# Patient Record
Sex: Male | Born: 1990 | Race: White | Hispanic: No | Marital: Single | Smoking: Current every day smoker
Health system: Southern US, Community
[De-identification: ages and names within clinical notes are randomized; demographics above are authoritative.]

## PROBLEM LIST (undated history)

## (undated) DIAGNOSIS — G43909 Migraine, unspecified, not intractable, without status migrainosus: Secondary | ICD-10-CM

## (undated) DIAGNOSIS — I10 Essential (primary) hypertension: Secondary | ICD-10-CM

## (undated) HISTORY — PX: HERNIA REPAIR: SHX51

## (undated) HISTORY — PX: ELBOW SURGERY: SHX618

## (undated) HISTORY — PX: TONSILLECTOMY: SUR1361

---
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
## 2009-06-15 ENCOUNTER — Ambulatory Visit: Payer: Self-pay | Admitting: Internal Medicine

## 2009-06-15 DIAGNOSIS — R51 Headache: Secondary | ICD-10-CM

## 2009-06-15 DIAGNOSIS — F17200 Nicotine dependence, unspecified, uncomplicated: Secondary | ICD-10-CM | POA: Insufficient documentation

## 2009-06-15 DIAGNOSIS — Z9189 Other specified personal risk factors, not elsewhere classified: Secondary | ICD-10-CM

## 2009-06-15 DIAGNOSIS — R519 Headache, unspecified: Secondary | ICD-10-CM | POA: Insufficient documentation

## 2009-06-15 DIAGNOSIS — R05 Cough: Secondary | ICD-10-CM

## 2009-06-15 DIAGNOSIS — R059 Cough, unspecified: Secondary | ICD-10-CM | POA: Insufficient documentation

## 2009-06-15 LAB — CONVERTED CEMR LAB: Chlamydia, Swab/Urine, PCR: NEGATIVE

## 2009-06-16 ENCOUNTER — Telehealth: Payer: Self-pay | Admitting: *Deleted

## 2009-06-16 LAB — CONVERTED CEMR LAB
Albumin: 4.3 g/dL (ref 3.5–5.2)
Alkaline Phosphatase: 67 units/L (ref 39–117)
Basophils Absolute: 0 10*3/uL (ref 0.0–0.1)
CO2: 30 meq/L (ref 19–32)
Chloride: 107 meq/L (ref 96–112)
Eosinophils Absolute: 0.1 10*3/uL (ref 0.0–0.7)
Glucose, Bld: 96 mg/dL (ref 70–99)
HCT: 44.5 % (ref 39.0–52.0)
HDL: 29.5 mg/dL — ABNORMAL LOW (ref >39.00)
Hemoglobin: 15.1 g/dL (ref 13.0–17.0)
LDL Cholesterol: 63 mg/dL (ref 0–99)
Lymphocytes Relative: 29.5 % (ref 12.0–46.0)
Lymphs Abs: 2.1 10*3/uL (ref 0.7–4.0)
Monocytes Absolute: 0.5 10*3/uL (ref 0.1–1.0)
Monocytes Relative: 7.2 % (ref 3.0–12.0)
Neutro Abs: 4.4 10*3/uL (ref 1.4–7.7)
Neutrophils Relative %: 61.9 % (ref 43.0–77.0)
Platelets: 224 10*3/uL (ref 150.0–400.0)
Potassium: 4.2 meq/L (ref 3.5–5.1)
Sodium: 143 meq/L (ref 135–145)
Total CHOL/HDL Ratio: 4
Triglycerides: 148 mg/dL (ref 0.0–149.0)

## 2009-06-29 ENCOUNTER — Encounter: Admission: RE | Admit: 2009-06-29 | Discharge: 2009-06-29 | Payer: Self-pay | Admitting: Psychiatry

## 2009-09-09 ENCOUNTER — Ambulatory Visit: Payer: Self-pay | Admitting: Internal Medicine

## 2009-09-09 DIAGNOSIS — G479 Sleep disorder, unspecified: Secondary | ICD-10-CM | POA: Insufficient documentation

## 2009-09-09 DIAGNOSIS — F988 Other specified behavioral and emotional disorders with onset usually occurring in childhood and adolescence: Secondary | ICD-10-CM | POA: Insufficient documentation

## 2010-08-01 IMAGING — CT CT ANGIO HEAD
1 of 6 series · 8 of 33 positions shown · IV contrast ([ID] OMNI 350)
Comparison: MRI 11/11/2006

CTA NECK

CLINICAL DATA: Headaches, family history of aneurysm.  MR
angiography of the intracranial circulation, performed in 6001, was
nondiagnostic due to braces.

CT ANGIOGRAPHY HEAD AND NECK
TECHNIQUE: Multidetector CT imaging of the head and neck was
performed using the standard protocol during bolus administration
of intravenous contrast. Multiplanar CT image reconstructions
including MIPs were obtained to evaluate the vascular anatomy.
Carotid stenosis measurements (when applicable) are obtained
utilizing NASCET criteria, using the distal internal carotid
diameter as the denominator.
Contrast: Omnipaque 350, 100 ml

[Series 703: axial thin · axial · 0.33mm/px · z∈[-234,+77]mm · 8 of 401 slices shown]
[im 45/401  soft-tissue]
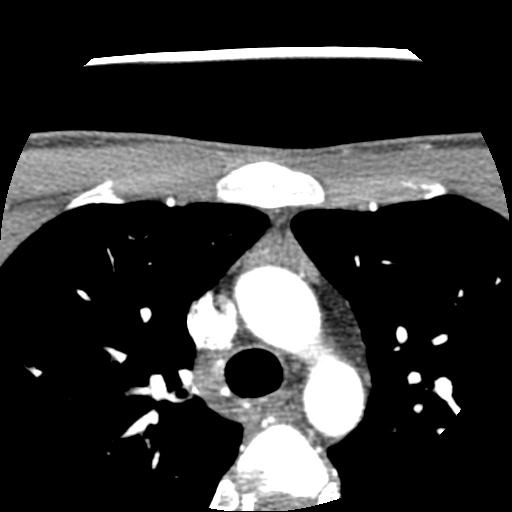
[im 89/401  bone]
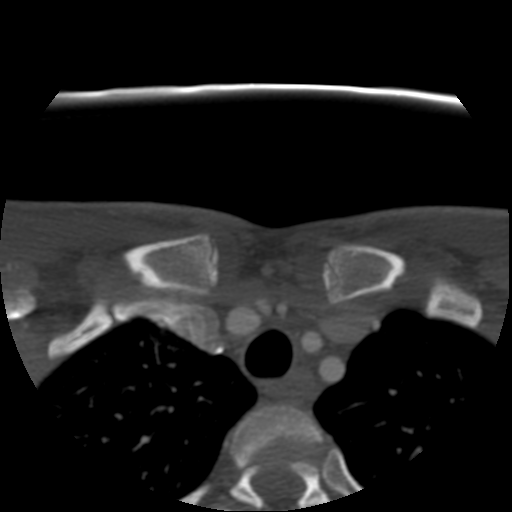
[im 134/401  soft-tissue]
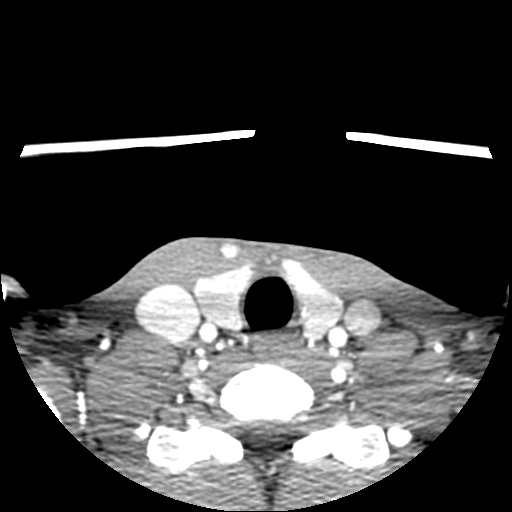
[im 178/401  bone]
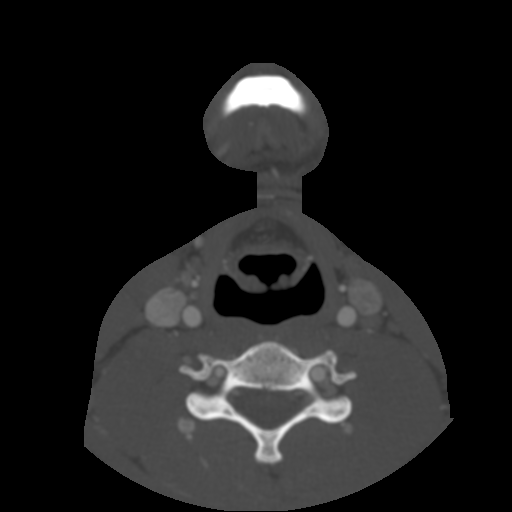
[im 223/401  soft-tissue]
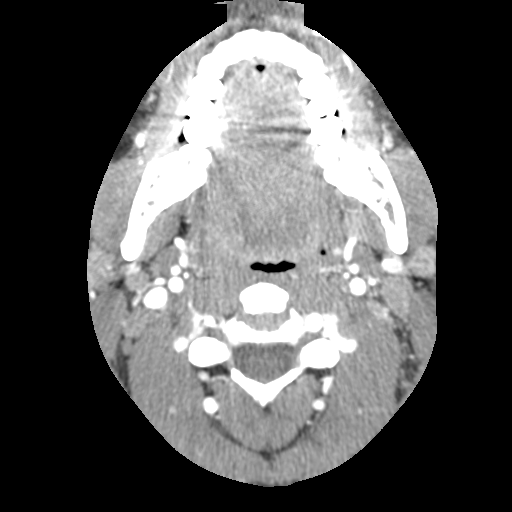
[im 267/401  bone]
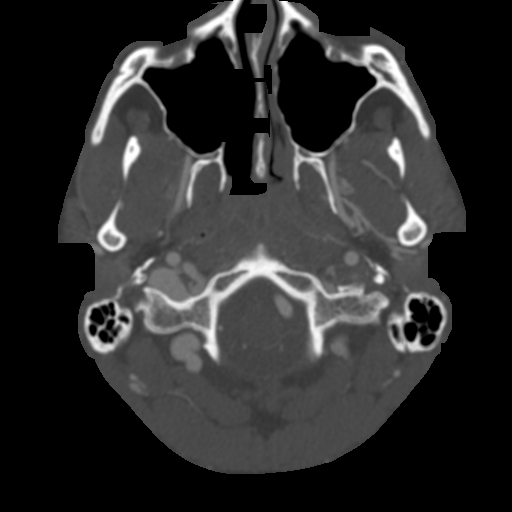
[im 312/401  soft-tissue]
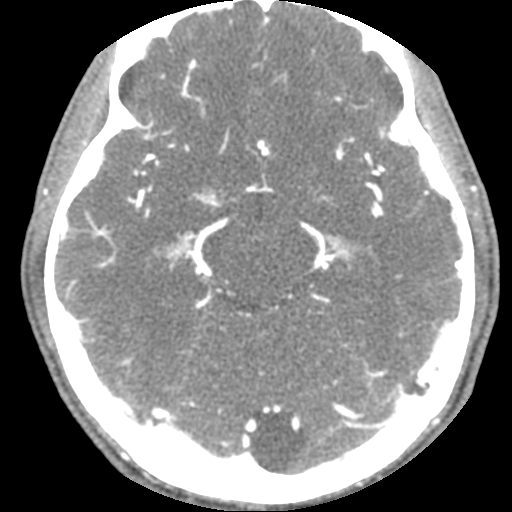
[im 356/401  bone]
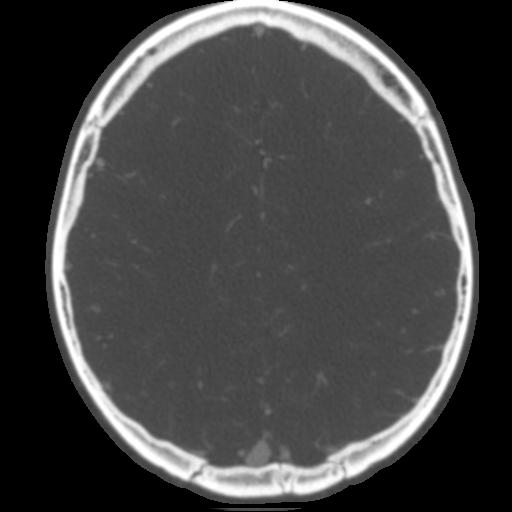

[8 of 33 positions shown; findings below may reference images not displayed]

FINDINGS: Three-vessel arch with conventional branching pattern.
No proximal stenosis.  Carotid bifurcations free of disease.  No
evidence for fibromuscular dysplasia or internal carotid artery
web.  Vertebral arteries widely patent with the left dominant.  No
ostial stenosis is present.  No aberrant vascular course in the
neck.

No visible neck mass.  Airway widely patent and symmetric.  Lung
apices clear.  No evidence for cervical spondylosis or other
osseous abnormality.

 Review of the MIP images confirms the above findings.
IMPRESSION: Negative CT angiography of the neck

CTA HEAD
FINDINGS: Widely patent internal carotid arteries, vertebral
arteries, and basilar artery. Visualized segments of the
intracranial circulation including the anterior, middle, posterior
cerebral arteries are all widely patent.

Special attention is directed to the intracranial branch points
where no evidence for aneurysm is seen.  The study is felt to
reliably exclude intracranial aneurysms 3 mm in size and greater.

The brain itself is within normal limits without evidence for
stroke, hemorrhage, mass lesion, hydrocephalus, or other
significant finding.  There is no abnormal enhancement
postcontrast.  Mega cisterna magna incidentally noted.

 Review of the MIP images confirms the above findings.
IMPRESSION: Negative CT angiography of the intracranial circulation.

## 2010-08-24 ENCOUNTER — Emergency Department (HOSPITAL_COMMUNITY)
Admission: EM | Admit: 2010-08-24 | Discharge: 2010-08-24 | Payer: Self-pay | Source: Home / Self Care | Admitting: Emergency Medicine

## 2011-02-09 NOTE — Op Note (Signed)
NAMEJESSE, Duncan             ACCOUNT NO.:  [id number]   MEDICAL RECORD NO.:  [id number]          PATIENT TYPE:  AMB   LOCATION:  DSC                          FACILITY:  [facility]   PHYSICIAN:  Alfonse Flavors, M.D.    DATE OF BIRTH:  [birth year]   DATE OF PROCEDURE:  08/14/2005  DATE OF DISCHARGE:                                 OPERATIVE REPORT   INDICATION AND JUSTIFICATION FOR PROCEDURE:  Tony Duncan is a 20-year-  old patient who was first seen in our office in June 2000.  At that time he  had a history of loud snoring and choking respirations at night.  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  Tony Duncan underwent adenoidectomy.  He did quite well  following surgery.  His mouth breathing and snoring were improved.  Tony Duncan  returned this month with a history of having increasing frequency of Strep  throats.  His mother reports that he had Strep throat seven times last year.  He had no history of increased bleeding.  Physical examination showed 2+  tonsils with mild erythema and yellow material in the tonsillar crypts.  Tony Duncan was felt to have recurrent Streptococcal tonsillitis.  He was a  candidate for tonsillectomy.  The indications and complications of the  procedure, including postoperative bleeding, were discussed in detail with  his mother.   PREOPERATIVE DIAGNOSIS:  Chronic tonsillitis.   POSTOPERATIVE DIAGNOSIS:  Chronic tonsillitis.   PROCEDURE:  Tonsillectomy.   ANESTHESIA:  General endotracheal anesthesia.   DESCRIPTION OF PROCEDURE:  Tony Duncan is brought to the operating room and  placed supine on the operating table.  He was induced with general  anesthesia and intubated with an orotracheal tube.  Face was draped in a  sterile fashion.  Mouth was opened with a Crowe-Davis mouth gag.  The left  tonsil was grasped with a tenaculum and retracted medially.  An incision was  made over the  anterior tonsillar pillar using suction cautery.  Using a  curved Dean knife, curved clamp and suction cautery the tonsil was dissected  free from the tonsillar fossa.  Similar technique was used for removal of  the right tonsil.  The tonsillar fossae were debrided with a Scientist, forensic.  Further bleeding areas were cauterized.  Marcaine 0.5% with  1:200,000 epinephrine was injected circumferentially around the tonsillar  fossae, a total of 2 mL of solution was used.  The small nasogastric tube  was passed into the stomach.  The gastric contents were evacuated.  The  pharynx was reinspected for bleeding.  There was no active bleeding.  Tony Duncan tolerated the procedure well and was taken to the recovery room in  satisfactory condition.   FOLLOW UP CARE:  Tony Duncan will be admitted to the recovery care unit for  observation, IV hydration and IV analgesia.  His discharge in the morning is  anticipated.   DISCHARGE MEDICATIONS:  Amoxicillin and Capitol with Codeine.   Tony Duncan will be seen for reevaluation in our office on August 24, 2005.  ______________________________  Alfonse Flavors, M.D.     JCM/MEDQ  D:  08/14/2005  T:  08/14/2005  Job:  [id number]   cc:   Maryellen Pile, M.D.

## 2011-02-09 NOTE — Op Note (Signed)
NAMEESPEN, Tony             ACCOUNT NO.:  [id number]   MEDICAL RECORD NO.:  [id number]          PATIENT TYPE:  INP   LOCATION:  6126                         FACILITY:  [facility]   PHYSICIAN:  Almedia Balls. Ranell Patrick, M.D. DATE OF BIRTH:  10/02/1990   DATE OF PROCEDURE:  01/20/2006  DATE OF DISCHARGE:  01/21/2006                                 OPERATIVE REPORT   PREOPERATIVE DIAGNOSES:  Left elbow fracture dislocation.   POSTOPERATIVE DIAGNOSES:  Left elbow fracture dislocation.   PROCEDURES PERFORMED:  Open reduction and internal fixation of left distal  humerus fracture/medial epicondyle, with reduction of dislocated elbow  joint.   ATTENDING SURGEON:  Almedia Balls. Ranell Patrick, M.D.   ASSISTANT:  Alexzandrew L. Julien Girt, P.A.   ANESTHESIA:  General.   ESTIMATED BLOOD LOSS:  Minimal.   TOURNIQUET TIME:  40 minutes.   INSTRUMENT COUNT:  Correct.   COMPLICATIONS:  Tony Duncan.   ANTIBIOTICS:  Routine antibiotics were given.   INDICATIONS:  The patient is a 20 year old male with a history of a fall  onto the outstretched left hand.  The patient suffered fracture dislocation,  left elbow.  The patient, due to extreme pain in the emergency department,  was brought to the operating room for definitive reduction and stabilization  of his fracture dislocation.   Informed consent was obtained.   DESCRIPTION OF PROCEDURE:  After an adequate level of anesthesia was  achieved, the patient was positioned supine on the operating room table.  The patient's fracture was closed reduced with some difficulty.  After  reduction of the elbow dislocation, the patient had smooth range of motion.  However, the patient's medial epicondyle was widely displaced, greater than  2 cm from its location on the medial column.  It was thus decided to proceed  with open reduction and internal fixation of the medial epicondylar fragment  to assist in stabilization of the elbow and insure a bony union.   After we had  placed a nonsterile tourniquet on the left proximal arm, the  left arm was sterilely prepped and draped in the usual manner.  We made a  longitudinal skin incision over the medial elbow.  Dissection was carried  sharply down through the subcutaneous tissues, using Metzenbaum scissors.  We were careful to stay well-anterior to the medial epicondylar fragment,  staying clear of the ulnar nerve.  We identified the fracture fragment and  its donor site on the medial column.  We also washed out the joint  thoroughly, lifting up the anterior soft tissues.  We identified the  orientation and location of the fragment, reduced that, held it in place  provisionally with K-wires and then affixed the fragment with a single 4.0  cannulated screw by Synthes with a washer.  This was a 50 mm screw.  It had  excellent purchase and, due to the patient's geometry of the fracture site  and compression at the fracture site, we felt that this was stable with a  single screw.  There was concern that placing more K-wires or screws might  fragment the fragment.  We thus went ahead and  removed our provisional K-  wire fixation.  We went ahead and closed the wound with a layered closure of  2-0 Vicryl, followed by 4-0 Monocryl for skin and Steri-Strips applied,  followed by a sterile dressing and a long-arm splint with the elbow at 80  degrees with neutral forearm rotation.           ______________________________  Almedia Balls Ranell Patrick, M.D.     SRN/MEDQ  D:  01/20/2006  T:  01/21/2006  Job:  [id number]
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
## 2011-02-09 NOTE — Discharge Summary (Signed)
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  Patient informed father of visual hallucinations of  dead loved ones.  The patient could not eat or sleep and had significant  anhedonia as well as suicidality and visions of dead people when closed  his eyes.  He has therapy with Glenice Laine, Ph.D., and is on no  medications.  For full details please see the typed admission assessment  by Dr. Elsie Saas.   SYNOPSIS OF PRESENT ILLNESS:  The patient resides with father and  stepmother as well as younger half-brother.  The patient feels abandoned  by 82 year old stepbrother leaving.  Biological mother died when the  patient was 78 weeks of age from a cerebral aneurysm and stepmother  reviews that father had been critical of the patient, with the patient  interpreting guilt and responsibility himself for mother's death over  the years.  The patient is disengaged from positive beliefs.  He is  failing all classes.  He is fixated on history, validation of self-  cutting such as the Mayan culture and certain [place] warriors though  he can agree with father that many of these are now extinct.  Father has  had depression in the past.  The patient has frequent headaches.  He  does use cannabis and cigarettes.  The patient refuses to take Concerta  or other medications for his ADHD.  The  patient himself is stressed  about his own cerebral circulation and father as well.  The patient has  also used vodka with a high tolerance.  He has been on Strattera in the  past in addition to Concerta.  HE IS ALLERGIC TO IODINE CONTRAST AGENTS  AND SULFA.   INITIAL MENTAL STATUS EXAM:  Dr. Elsie Saas noted borderline character  traits initially in the patient's interpersonal interaction and  communication.  He reported depression and anxiety while manifesting  dramatic intentions and self injurious behavior.  He noted suicide plan  such as jumping from [place].  He had no frank psychosis and did not  report definite dissociation or delirium, even with intoxication.  Still  substance abuse was a significant concern.   LABORATORY FINDINGS:  CBC was normal with white count 8400, hemoglobin  15, MCV of 85 and platelet count 216,000.  Comprehensive metabolic panel  was normal with sodium 140, potassium 4.1, random glucose 83, creatinine  0.98, CO2 29, calcium 9.5, albumin 4, AST 22, ALT 14 and GGT 16.  Free  T4 was normal at 1.08 and TSH at  0.474.  Urine drug screen was negative  with creatinine of 168 mg/dL.  Urinalysis was normal with specific  gravity of 1.022 and pH 6, except he did have protein of 30 mg/dL and a  few bacteria with mucus present.  RPR was nonreactive.  Urine probe for  gonorrhea and chlamydia trachomatis by DNA amplification were both  negative.   HOSPITAL COURSE AND TREATMENT:  General medical exam by Jorje Guild, PA-C,  the past tonsillectomy and ORIF of the left elbow for fracture both at  age 7.  He has facial acne and is disheveled.  He reports sexual  activity.  His neurological exam was otherwise intact.  He did appear to  have some scoliosis by forward flexion exam.  Vital signs were normal  throughout hospital stay with weight of 54 kg and height of 168 cm on  admission.  Initial blood pressure was 114/66 with heart rate of 69  supine, and 116/66 with  heart rate of 131 standing.  At the time of  discharge, supine blood pressure was 112/64 with heart rate of 62, and  standing blood pressure 97/56 with heart rate of 125.  The patient  gradually but definitely engaged in the treatment program.  As he began  to open up and look at issues in therapy, he became more silly and  expressive, even though less sarcastic and devaluing.  The patient's  defensiveness was an obstacle to access to definitive change including  for cannabis, oppositionality and CPT work on mood.  Father and  stepmother were in this way very apprehensive about having the patient  return home.  Particularly, father would be unavailable for several days  and feels that only he can facilitate the patient's containment on  initial return home.  The patient was scheduled for an outpatient MR and  MRA of the brain for possible cerebral aneurysm the day after discharge  including relative to headaches, and this was preauthorized with [facility] with result to go to Dr. Maryellen Pile.  The patient  started Luvox 100 mg every evening but found that it was activating and  interfering with sleep.  He switched to morning administration and did  well though he and family were doubtful about medication, even though  they had no specific side effect or other diagnostic concern.  Family  did work through the capacity for discharge though still hesitant about  patient's progress.  The patient ceased self-cutting and reported 75%  certainty he would not relapse after discharge.  He read in [employer]  that Luvox is only for OCD and not depression, and was educated on the  appropriate use of Luvox in either.  The patient found Luvox somewhat  activating so that it was switched to morning administration.  He was  discharged in improved condition, still debating with father about the  historical basis of cutting but at least not cutting at the time of discharge.  Father thinks  that the patient has genuine incorporation and  interjection of current family values for interpersonal style.  He  required no seclusion or restraint during the hospital stay.  He had  substance abuse consultation by Cleophas Dunker, LMFT, CSAC, CTS on  October 29, 2006, concluding alcohol and cannabis abuse with  recommendations.   FINAL DIAGNOSES:  AXIS I:  1. Major depression, recurrent, severe with early psychotic features.  2. Attention deficit hyperactivity disorder, combined type, moderate      severity  3. Alcohol abuse.  4. Cannabis abuse.  5. Rule out oppositional defiant disorder (provisional diagnosis).  6. Parent-child problem.  7. Other specified family circumstances.  8. Other interpersonal problem.  9. Noncompliance with treatment.  AXIS II:  Diagnosis deferred.  AXIS III:  1. Headaches.  2. Status post biopsy of cervical lymphadenopathy.  3. Acne.  4. ALLERGY TO SULFA AND IODINE CONTRAST.  5. Eyeglasses.  6. Mild scoliosis.  AXIS IV:  Stressors family severe to extreme, acute and chronic; school  severe, acute and chronic; phase of life severe, acute and chronic.  AXIS V:  Global assessment of functioning on admission 25 with highest  in last year 58, and discharge global assessment of functioning was 52.   PLAN:  The patient was discharged to father in improved condition, free  of suicide and  homicidal ideation.  He had no psychotic symptoms, and  was compliant with treatment though still argumentative regarding  philosophy and history.  He follows a regular diet has no restrictions  on physical activity.  He will abstain from any contact with alcohol or  cannabis.  Crisis and safety plans are outlined if needed.  His self  cutting wounds are healing well, and require no specialized care.  He is  prescribed fluvoxamine 100 mg every morning, quantity #30 with 1 refill,  and he  and father are educated on the FDA guidelines and black box warnings.  The patient  will see Dr. Carolanne Grumbling December 03, 2006 at 0900 for  psychiatric follow-up.  He will see Jerral Bonito [facility] November 11, 2006 at  1600 for therapy.  He will have an MR/MRA of the brain the following day  with results to go to Dr. Maryellen Pile.      Lalla Brothers, MD  Electronically Signed     GEJ/MEDQ  D:  11/08/2006  T:  11/09/2006  Job:  [id number]   cc:   Carolanne Grumbling, M.D.   Jerral Bonito, LPC  [address]  [place] [address]

## 2011-02-24 ENCOUNTER — Emergency Department (HOSPITAL_COMMUNITY)
Admission: EM | Admit: 2011-02-24 | Discharge: 2011-02-24 | Disposition: A | Discharge status: Home / Self Care | Payer: Self-pay | Attending: Emergency Medicine | Admitting: Emergency Medicine

## 2011-02-24 DIAGNOSIS — R220 Localized swelling, mass and lump, head: Secondary | ICD-10-CM | POA: Insufficient documentation

## 2011-02-24 DIAGNOSIS — J029 Acute pharyngitis, unspecified: Secondary | ICD-10-CM | POA: Insufficient documentation

## 2011-02-24 DIAGNOSIS — K089 Disorder of teeth and supporting structures, unspecified: Secondary | ICD-10-CM | POA: Insufficient documentation

## 2020-10-26 DIAGNOSIS — F419 Anxiety disorder, unspecified: Secondary | ICD-10-CM | POA: Diagnosis not present

## 2020-10-26 DIAGNOSIS — G4701 Insomnia due to medical condition: Secondary | ICD-10-CM | POA: Diagnosis not present

## 2020-10-26 DIAGNOSIS — F32A Depression, unspecified: Secondary | ICD-10-CM | POA: Diagnosis not present

## 2020-10-26 DIAGNOSIS — R030 Elevated blood-pressure reading, without diagnosis of hypertension: Secondary | ICD-10-CM | POA: Diagnosis not present

## 2020-11-08 DIAGNOSIS — F419 Anxiety disorder, unspecified: Secondary | ICD-10-CM | POA: Diagnosis not present

## 2020-11-08 DIAGNOSIS — G4701 Insomnia due to medical condition: Secondary | ICD-10-CM | POA: Diagnosis not present

## 2020-11-08 DIAGNOSIS — R030 Elevated blood-pressure reading, without diagnosis of hypertension: Secondary | ICD-10-CM | POA: Diagnosis not present

## 2020-11-08 DIAGNOSIS — L84 Corns and callosities: Secondary | ICD-10-CM | POA: Diagnosis not present

## 2020-12-01 DIAGNOSIS — Z1330 Encounter for screening examination for mental health and behavioral disorders, unspecified: Secondary | ICD-10-CM | POA: Diagnosis not present

## 2020-12-06 DIAGNOSIS — F5104 Psychophysiologic insomnia: Secondary | ICD-10-CM | POA: Diagnosis not present

## 2020-12-06 DIAGNOSIS — F1121 Opioid dependence, in remission: Secondary | ICD-10-CM | POA: Diagnosis not present

## 2020-12-06 DIAGNOSIS — F419 Anxiety disorder, unspecified: Secondary | ICD-10-CM | POA: Diagnosis not present

## 2020-12-07 DIAGNOSIS — G4701 Insomnia due to medical condition: Secondary | ICD-10-CM | POA: Diagnosis not present

## 2020-12-07 DIAGNOSIS — F1990 Other psychoactive substance use, unspecified, uncomplicated: Secondary | ICD-10-CM | POA: Diagnosis not present

## 2020-12-07 DIAGNOSIS — F419 Anxiety disorder, unspecified: Secondary | ICD-10-CM | POA: Diagnosis not present

## 2020-12-07 DIAGNOSIS — I10 Essential (primary) hypertension: Secondary | ICD-10-CM | POA: Diagnosis not present

## 2020-12-07 DIAGNOSIS — F32A Depression, unspecified: Secondary | ICD-10-CM | POA: Diagnosis not present

## 2020-12-12 DIAGNOSIS — F419 Anxiety disorder, unspecified: Secondary | ICD-10-CM | POA: Diagnosis not present

## 2020-12-19 DIAGNOSIS — F341 Dysthymic disorder: Secondary | ICD-10-CM | POA: Diagnosis not present

## 2020-12-19 DIAGNOSIS — F5104 Psychophysiologic insomnia: Secondary | ICD-10-CM | POA: Diagnosis not present

## 2020-12-19 DIAGNOSIS — F419 Anxiety disorder, unspecified: Secondary | ICD-10-CM | POA: Diagnosis not present

## 2020-12-19 DIAGNOSIS — R41840 Attention and concentration deficit: Secondary | ICD-10-CM | POA: Diagnosis not present

## 2021-11-26 ENCOUNTER — Other Ambulatory Visit: Payer: Self-pay

## 2021-11-26 ENCOUNTER — Encounter (HOSPITAL_COMMUNITY): Payer: Self-pay | Admitting: Emergency Medicine

## 2021-11-26 ENCOUNTER — Emergency Department (HOSPITAL_COMMUNITY)
Admission: EM | Admit: 2021-11-26 | Discharge: 2021-11-26 | Disposition: A | Discharge status: Home / Self Care | Payer: PRIVATE HEALTH INSURANCE | Attending: Emergency Medicine | Admitting: Emergency Medicine

## 2021-11-26 DIAGNOSIS — R110 Nausea: Secondary | ICD-10-CM | POA: Diagnosis not present

## 2021-11-26 DIAGNOSIS — R519 Headache, unspecified: Secondary | ICD-10-CM

## 2021-11-26 HISTORY — DX: Migraine, unspecified, not intractable, without status migrainosus: G43.909

## 2021-11-26 MED ORDER — BUTALBITAL-APAP-CAFFEINE 50-325-40 MG PO TABS: 1.0000 | ORAL_TABLET | Freq: Four times a day (QID) | ORAL | 0 refills | Status: AC | PRN

## 2021-11-26 MED ORDER — KETOROLAC TROMETHAMINE 15 MG/ML IJ SOLN
15.0000 mg | Freq: Once | INTRAMUSCULAR | Status: AC
  Administered 2021-11-26: 15 mg via INTRAVENOUS
  Filled 2021-11-26: qty 1

## 2021-11-26 MED ORDER — METOCLOPRAMIDE HCL 5 MG/ML IJ SOLN
10.0000 mg | Freq: Once | INTRAMUSCULAR | Status: AC
Start: 2021-11-26 — End: 2021-11-26
  Administered 2021-11-26: 10 mg via INTRAVENOUS
  Filled 2021-11-26: qty 2

## 2021-11-26 MED ORDER — CYCLOBENZAPRINE HCL 10 MG PO TABS: 10.0000 mg | ORAL_TABLET | Freq: Two times a day (BID) | ORAL | 0 refills | Status: DC | PRN

## 2021-11-26 MED ORDER — DIPHENHYDRAMINE HCL 50 MG/ML IJ SOLN
50.0000 mg | Freq: Once | INTRAMUSCULAR | Status: AC
  Administered 2021-11-26: 50 mg via INTRAVENOUS
  Filled 2021-11-26: qty 1

## 2021-11-26 MED ORDER — SODIUM CHLORIDE 0.9 % IV BOLUS
1000.0000 mL | Freq: Once | INTRAVENOUS | Status: AC
  Administered 2021-11-26: 1000 mL via INTRAVENOUS

## 2021-11-26 NOTE — ED Triage Notes (Signed)
Pt reports headache x 3 days. Pt reports taking medication for migraine with no relief. Denies fevers.  ?

## 2021-11-26 NOTE — ED Notes (Signed)
MD messaged and notified of patients blood pressure running high and patient still complaining of pain. JRPRN ?

## 2021-11-26 NOTE — ED Provider Notes (Signed)
?[facility]-EMERGENCY DEPT ?Provider Note ? ? ?CSN: [id number] ?Arrival date & time: 11/26/21  4081 ? ?  ? ?History ? ?Chief Complaint  ?Patient presents with  ? Headache  ? ? ?Jeanpaul Biehl is a 31 y.o. male. ? ?The history is provided by the patient. No language interpreter was used.  ?Headache ? ?31 year old male significant history of migraine, who presents for evaluation of headache.  Patient here with a gradual onset progressive worsening headache ongoing for the past 3 days.  Headache originate to the back of his head and neck with occasional sharp shooting pain favor more on the right side.  He now endorse a dull sensation throughout his head.  He endorsed mild nausea and light sensitivity.  Headaches persistent, moderate in intensity.  He denies any associated fever, loss of vision, runny nose sneezing coughing sore throat, vomiting or diarrhea focal numbness or focal weakness or rash.  Does report having history of headache in the past.  He tries taking Relpax but report no improvement.  Patient is a social drinker, and does use tobacco.  He does not wear any contact lenses or prescription eyeglasses. ? ?Home Medications ?Prior to Admission medications   ?Not on File  ?   ? ?Allergies    ?Patient has no allergy information on record.   ? ?Review of Systems   ?Review of Systems  ?Neurological:  Positive for headaches.  ?All other systems reviewed and are negative. ? ?Physical Exam ?Updated Vital Signs ?BP (!) 162/117 (BP Location: Right Arm)   Pulse 81   Temp 97.9 ?F (36.6 ?C) (Oral)   Resp 18   SpO2 100%  ?Physical Exam ?Vitals and nursing note reviewed.  ?Constitutional:   ?   General: He is not in acute distress. ?   Appearance: He is well-developed.  ?HENT:  ?   Head: Normocephalic and atraumatic.  ?   Mouth/Throat:  ?   Mouth: Mucous membranes are moist.  ?Eyes:  ?   General: No visual field deficit. ?   Extraocular Movements: Extraocular movements intact.  ?    Conjunctiva/sclera: Conjunctivae normal.  ?   Pupils: Pupils are equal, round, and reactive to light.  ?Cardiovascular:  ?   Rate and Rhythm: Normal rate and regular rhythm.  ?Pulmonary:  ?   Effort: Pulmonary effort is normal.  ?   Breath sounds: Normal breath sounds.  ?Abdominal:  ?   General: Bowel sounds are normal.  ?Musculoskeletal:  ?   Cervical back: Normal range of motion and neck supple. No rigidity.  ?Skin: ?   General: Skin is warm.  ?   Findings: No rash.  ?Neurological:  ?   Mental Status: He is alert.  ?   GCS: GCS eye subscore is 4. GCS verbal subscore is 5. GCS motor subscore is 6.  ?   Cranial Nerves: No cranial nerve deficit, dysarthria or facial asymmetry.  ?   Sensory: No sensory deficit.  ?Psychiatric:     ?   Mood and Affect: Mood normal.  ? ? ?ED Results / Procedures / Treatments   ?Labs ?(all labs ordered are listed, but only abnormal results are displayed) ?Labs Reviewed - No data to display ? ?EKG ?None ? ?Radiology ?No results found. ? ?Procedures ?Procedures  ? ? ?Medications Ordered in ED ?Medications  ?ketorolac (TORADOL) 15 MG/ML injection 15 mg (15 mg Intravenous Given 11/26/21 1105)  ?diphenhydrAMINE (BENADRYL) injection 50 mg (50 mg Intravenous Given 11/26/21 1106)  ?metoCLOPramide (REGLAN)  injection 10 mg (10 mg Intravenous Given 11/26/21 1106)  ?sodium chloride 0.9 % bolus 1,000 mL (1,000 mLs Intravenous New Bag/Given 11/26/21 1106)  ? ? ?ED Course/ Medical Decision Making/ A&P ?  ?                        ?Medical Decision Making ?Risk ?Prescription drug management. ? ? ?BP (!) 162/117 (BP Location: Right Arm)   Pulse 81   Temp 97.9 ?F (36.6 ?C) (Oral)   Resp 18   SpO2 100%  ? ?10:28 AM ?Patient presenting with headache. History of similar headaches, no abnormal symptoms for patient. No evidence of acute intracranial hemorrhage, venous sinus thrombosis, central nervous system infection, ocular disease. Patient was provided with toradol/benadryl/reglan  and IVF with significant  improvement in headache. Patient was discharged home. Patient advised to return to ER if alteration in mental status, onset of fevers, visual changes, or peripheral weakness/numbness/tingling. ? ?Impression ?Headache ? ? ?Plan ? ?  * ?Discharge from ED ?  * ?Fioricet for residual headache ?  * ? ?  * ?Advised Pt on supportive therapies, including resting on a firm mattress (on back w/ knees raised on a pillow or on side w/ knees bent at 45-90deg), resting in a dark quiet place, relaxation techniques, hydrating w/ >8cups of H2O/d and uncaffeinated and EtOH beverages, heat application for 20-57min q4-5hr, refrain from smoking and smoke exposures. ?  * ?F/U with PCP in 2 days if headache continues ?  * ?Advised Pt to monitor for worrisome S/Sx, including neurosensory/motor deficits, difficulty articulating, visual disturbances, dizziness, neck stiffness, decreased cognition, worsening pain, and N/V/F/C. Instructed Pt to f/up w/ ER immediately should S/Sx worsen or PCP should S/Sx not improve. Pt verbally expressed understanding and all questions were addressed to Pt's satisfaction. ? ? ? ? ? ? ? ? ?Final Clinical Impression(s) / ED Diagnoses ?Final diagnoses:  ?Bad headache  ? ? ?Rx / DC Orders ?ED Discharge Orders   ? ?      Ordered  ?  butalbital-acetaminophen-caffeine (FIORICET) 50-325-40 MG tablet  Every 6 hours PRN       ? 11/26/21 1250  ?  cyclobenzaprine (FLEXERIL) 10 MG tablet  2 times daily PRN       ? 11/26/21 1251  ? ?  ?  ? ?  ? ? ?  ?Fayrene Helper, PA-C ?11/26/21 1253 ? ?  ?Cathren Laine, MD ?11/26/21 1312 ? ?

## 2022-05-27 ENCOUNTER — Other Ambulatory Visit: Payer: Self-pay

## 2022-05-27 ENCOUNTER — Encounter (HOSPITAL_BASED_OUTPATIENT_CLINIC_OR_DEPARTMENT_OTHER): Payer: Self-pay

## 2022-05-27 ENCOUNTER — Emergency Department (HOSPITAL_BASED_OUTPATIENT_CLINIC_OR_DEPARTMENT_OTHER)
Admission: EM | Admit: 2022-05-27 | Discharge: 2022-05-27 | Disposition: A | Discharge status: Home / Self Care | Payer: PRIVATE HEALTH INSURANCE | Attending: Emergency Medicine | Admitting: Emergency Medicine

## 2022-05-27 DIAGNOSIS — R059 Cough, unspecified: Secondary | ICD-10-CM | POA: Diagnosis not present

## 2022-05-27 DIAGNOSIS — U071 COVID-19: Secondary | ICD-10-CM

## 2022-05-27 DIAGNOSIS — Z20822 Contact with and (suspected) exposure to covid-19: Secondary | ICD-10-CM | POA: Diagnosis not present

## 2022-05-27 LAB — RESP PANEL BY RT-PCR (FLU A&B, COVID) ARPGX2
Influenza A by PCR: NEGATIVE
Influenza B by PCR: NEGATIVE
SARS Coronavirus 2 by RT PCR: POSITIVE — AB

## 2022-05-27 LAB — GROUP A STREP BY PCR: Group A Strep by PCR: NOT DETECTED

## 2022-05-27 MED ORDER — BENZONATATE 100 MG PO CAPS: 100.0000 mg | ORAL_CAPSULE | Freq: Three times a day (TID) | ORAL | 0 refills | Status: AC

## 2022-05-27 NOTE — ED Triage Notes (Signed)
Patient here POV from Home.  Endorses URI Symptoms including Productive Cough, Sore Throat, Aches, Chills.   Recent Contact with COVID-19 Positive Individuals.   NAD Noted during Triage. A&Ox4. GCS 15. Ambulatory.

## 2022-05-27 NOTE — ED Provider Notes (Signed)
[facility] EMERGENCY DEPT Provider Note   CSN: [id number] Arrival date & time: 05/27/22  1354     History  Chief Complaint  Patient presents with   Cough    Tony Duncan is a 31 y.o. male.   Cough    Patient with no contributable medical history presents today due to 3 to 4 days of "flulike symptoms".  When asked to characterize he describes as body aches, loss of taste, headaches and dry nonproductive cough.  Denies feeling short of breath or having chest pain.  His all family is home with COVID.  He works in a hotel.  Home Medications Prior to Admission medications   Medication Sig Start Date End Date Taking? Authorizing Provider  butalbital-acetaminophen-caffeine (FIORICET) 50-325-40 MG tablet Take 1 tablet by mouth every 6 (six) hours as needed for headache. 11/26/21 11/26/22  Fayrene Helper, PA-C  cyclobenzaprine (FLEXERIL) 10 MG tablet Take 1 tablet (10 mg total) by mouth 2 (two) times daily as needed for muscle spasms. 11/26/21   Fayrene Helper, PA-C      Allergies    Sulfa antibiotics    Review of Systems   Review of Systems  Respiratory:  Positive for cough.     Physical Exam Updated Vital Signs BP (!) 116/95 (BP Location: Right Arm)   Pulse (!) 111   Temp 98.2 F (36.8 C)   Resp 18   Ht 5\' 8"  (1.727 m)   Wt 60.3 kg   SpO2 99%   BMI 20.21 kg/m  Physical Exam Vitals and nursing note reviewed. Exam conducted with a chaperone present.  Constitutional:      Appearance: Normal appearance.  HENT:     Head: Normocephalic and atraumatic.  Eyes:     General: No scleral icterus.       Right eye: No discharge.        Left eye: No discharge.     Extraocular Movements: Extraocular movements intact.     Pupils: Pupils are equal, round, and reactive to light.  Cardiovascular:     Rate and Rhythm: Normal rate and regular rhythm.     Pulses: Normal pulses.     Heart sounds: Normal heart sounds. No murmur heard.    No friction rub. No gallop.  Pulmonary:      Effort: Pulmonary effort is normal. No respiratory distress.     Breath sounds: Normal breath sounds.  Abdominal:     General: Abdomen is flat. Bowel sounds are normal. There is no distension.     Palpations: Abdomen is soft.     Tenderness: There is no abdominal tenderness.  Skin:    General: Skin is warm and dry.     Coloration: Skin is not jaundiced.  Neurological:     Mental Status: He is alert. Mental status is at baseline.     Coordination: Coordination normal.     ED Results / Procedures / Treatments   Labs (all labs ordered are listed, but only abnormal results are displayed) Labs Reviewed  RESP PANEL BY RT-PCR (FLU A&B, COVID) ARPGX2 - Abnormal; Notable for the following components:      Result Value   SARS Coronavirus 2 by RT PCR POSITIVE (*)    All other components within normal limits  GROUP A STREP BY PCR    EKG None  Radiology No results found.  Procedures Procedures    Medications Ordered in ED Medications - No data to display  ED Course/ Medical Decision Making/ A&P  Medical Decision Making  Patient presents due to nonproductive cough.   Patient's lungs are clear to auscultation on exam.  Mildly tachycardic but with regular rhythm.  Upper and lower extremities symmetric, abdomen soft and nontender.  Patient is afebrile.  I ordered and viewed work-up.  Primary care potation patient is COVID.  Considered plain film given acuity of symptoms, lack of focal consolidation on exam, positive COVID I think is symptoms most likely attributable to COVID rather than underlying pneumonia.  Do not think x-rays are indicated.  Will discharge with supportive care, Tessalon Perles and strict return precautions.  Patient discharged stable condition.        Final Clinical Impression(s) / ED Diagnoses Final diagnoses:  None    Rx / DC Orders ED Discharge Orders     None         Theron Arista, [place] 05/27/22 1654     Terrilee Files, MD 05/28/22 1101

## 2022-05-27 NOTE — Discharge Instructions (Signed)
Take Tylenol and Motrin for body aches.  You can take Tessalon Perles every 8 hours as needed for cough.

## 2023-02-12 ENCOUNTER — Emergency Department (HOSPITAL_BASED_OUTPATIENT_CLINIC_OR_DEPARTMENT_OTHER)
Admission: EM | Admit: 2023-02-12 | Discharge: 2023-02-12 | Disposition: A | Discharge status: Home / Self Care | Payer: PRIVATE HEALTH INSURANCE | Attending: Emergency Medicine | Admitting: Emergency Medicine

## 2023-02-12 ENCOUNTER — Encounter (HOSPITAL_BASED_OUTPATIENT_CLINIC_OR_DEPARTMENT_OTHER): Payer: Self-pay | Admitting: Emergency Medicine

## 2023-02-12 ENCOUNTER — Other Ambulatory Visit (HOSPITAL_BASED_OUTPATIENT_CLINIC_OR_DEPARTMENT_OTHER): Payer: Self-pay

## 2023-02-12 ENCOUNTER — Other Ambulatory Visit: Payer: Self-pay

## 2023-02-12 DIAGNOSIS — M545 Low back pain, unspecified: Secondary | ICD-10-CM | POA: Insufficient documentation

## 2023-02-12 MED ORDER — CYCLOBENZAPRINE HCL 10 MG PO TABS
10.0000 mg | ORAL_TABLET | Freq: Two times a day (BID) | ORAL | 0 refills | Status: DC | PRN
  Filled 2023-02-12: qty 20, 10d supply, fill #0

## 2023-02-12 NOTE — ED Provider Notes (Signed)
[place] EMERGENCY DEPARTMENT AT [facility] Provider Note   CSN: [id number] Arrival date & time: 02/12/23  1601     History  Chief Complaint  Patient presents with   Back Pain    Tony Duncan is a 32 y.o. male.  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  Does not do a lot of light heavy at baseline.  Denies any weakness, numbness, tingling.  No problems going to the bathroom.  Has had a lot of general soreness in his lower back.  Denies any specific trauma.  Denies any issues with ambulation.  The history is provided by the patient.       Home Medications Prior to Admission medications   Medication Sig Start Date End Date Taking? Authorizing Provider  cyclobenzaprine (FLEXERIL) 10 MG tablet Take 1 tablet (10 mg total) by mouth 2 (two) times daily as needed for muscle spasms. 02/12/23  Yes Angas Isabell, DO  benzonatate (TESSALON) 100 MG capsule Take 1 capsule (100 mg total) by mouth every 8 (eight) hours. 05/27/22   Theron Arista, PA-C      Allergies    Iodinated contrast media, Iodine, and Sulfa antibiotics    Review of Systems   Review of Systems  Physical Exam Updated Vital Signs BP (!) 134/94 (BP Location: Right Arm)   Pulse 95   Temp 98.1 F (36.7 C) (Oral)   Resp 18   SpO2 98%  Physical Exam Vitals and nursing note reviewed.  Constitutional:      General: He is not in acute distress.    Appearance: He is well-developed. He is not ill-appearing.  HENT:     Head: Normocephalic and atraumatic.     Nose: Nose normal.     Mouth/Throat:     Mouth: Mucous membranes are moist.  Eyes:     Extraocular Movements: Extraocular movements intact.     Conjunctiva/sclera: Conjunctivae normal.     Pupils: Pupils are equal, round, and  reactive to light.  Cardiovascular:     Rate and Rhythm: Normal rate and regular rhythm.     Pulses: Normal pulses.     Heart sounds: Normal heart sounds. No murmur heard. Pulmonary:     Effort: Pulmonary effort is normal. No respiratory distress.     Breath sounds: Normal breath sounds.  Abdominal:     Palpations: Abdomen is soft.     Tenderness: There is no abdominal tenderness.  Musculoskeletal:        General: Tenderness present. No swelling. Normal range of motion.     Cervical back: Normal range of motion and neck supple.     Comments: No midline spinal tenderness, tenderness to paraspinal lumbar muscles bilaterally with increased  Skin:    General: Skin is warm and dry.     Capillary Refill: Capillary refill takes less than 2 seconds.  Neurological:     General: No focal deficit present.     Mental Status: He is alert and oriented to person, place, and time.     Cranial Nerves: No cranial nerve deficit.     Sensory: No sensory deficit.     Motor: No weakness.     Coordination: Coordination normal.     Comments: 5+ out of 5 strength throughout, normal sensation,  no drift, normal finger-nose-finger, normal speech  Psychiatric:        Mood and Affect: Mood normal.     ED Results / Procedures / Treatments   Labs (all labs ordered are listed, but only abnormal results are displayed) Labs Reviewed - No data to display  EKG None  Radiology No results found.  Procedures Procedures    Medications Ordered in ED Medications - No data to display  ED Course/ Medical Decision Making/ A&P                             Medical Decision Making Risk Prescription drug management.   Ervine Pembleton is here with back pain.  Normal vitals.  No fever.  Lot of heavy lifting yesterday which is outside the normal for him.  Felt very sore in his low back this morning.  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  He has been taking some Tylenol with some improvement.  He denies any  numbness or weakness or specific trauma.  No signs to suggest cauda equina.  He is tender mostly in the paraspinal lumbar muscles bilaterally.  Differential diagnosis likely muscle spasm and general soreness from physical activity yesterday.  He has normal vitals.  He is well-appearing.  He is going to the bathroom okay.  I do not think he has any other major process going on.  He is ambulatory in the room.  Will prescribe Flexeril.  Recommend Tylenol and ibuprofen and hydration at home.  Discharged in good condition.  This chart was dictated using voice recognition software.  Despite best efforts to proofread,  errors can occur which can change the documentation meaning.         Final Clinical Impression(s) / ED Diagnoses Final diagnoses:  Acute low back pain without sciatica, unspecified back pain laterality    Rx / DC Orders ED Discharge Orders          Ordered    cyclobenzaprine (FLEXERIL) 10 MG tablet  2 times daily PRN        02/12/23 1622              Lakendria Nicastro, DO 02/12/23 1625

## 2023-02-12 NOTE — ED Notes (Signed)
Patient verbalizes understanding of discharge instructions. Opportunity for questioning and answers were provided. Patient discharged from ED.  °

## 2023-02-12 NOTE — Discharge Instructions (Addendum)
Recommend 400 mg ibuprofen every 8 hours as needed for pain.  Recommend 1000 mg of Tylenol every 6 hours as needed for pain.  Take muscle relaxant Flexeril to help.  Do not mix alcohol or drugs or dangerous activities including driving as Flexeril is sedating.  Recommend that you aggressively hydrate with Gatorade and water at home.

## 2023-02-12 NOTE — ED Triage Notes (Signed)
Pt arrives to ED with c/o back pain that started yesterday.

## 2023-03-22 ENCOUNTER — Other Ambulatory Visit: Payer: Self-pay

## 2023-03-22 ENCOUNTER — Encounter (HOSPITAL_BASED_OUTPATIENT_CLINIC_OR_DEPARTMENT_OTHER): Payer: Self-pay

## 2023-03-22 ENCOUNTER — Emergency Department (HOSPITAL_BASED_OUTPATIENT_CLINIC_OR_DEPARTMENT_OTHER): Payer: PRIVATE HEALTH INSURANCE

## 2023-03-22 ENCOUNTER — Emergency Department (HOSPITAL_BASED_OUTPATIENT_CLINIC_OR_DEPARTMENT_OTHER)
Admission: EM | Admit: 2023-03-22 | Discharge: 2023-03-22 | Disposition: A | Discharge status: Home / Self Care | Payer: PRIVATE HEALTH INSURANCE | Attending: Emergency Medicine | Admitting: Emergency Medicine

## 2023-03-22 DIAGNOSIS — Z20822 Contact with and (suspected) exposure to covid-19: Secondary | ICD-10-CM | POA: Insufficient documentation

## 2023-03-22 DIAGNOSIS — K2900 Acute gastritis without bleeding: Secondary | ICD-10-CM | POA: Diagnosis not present

## 2023-03-22 DIAGNOSIS — R1012 Left upper quadrant pain: Secondary | ICD-10-CM | POA: Diagnosis present

## 2023-03-22 LAB — COMPREHENSIVE METABOLIC PANEL
ALT: 19 U/L (ref 0–44)
AST: 19 U/L (ref 15–41)
Albumin: 4.2 g/dL (ref 3.5–5.0)
Alkaline Phosphatase: 58 U/L (ref 38–126)
Anion gap: 8 (ref 5–15)
BUN: 12 mg/dL (ref 6–20)
CO2: 28 mmol/L (ref 22–32)
Calcium: 9.3 mg/dL (ref 8.9–10.3)
Chloride: 103 mmol/L (ref 98–111)
Creatinine, Ser: 0.97 mg/dL (ref 0.61–1.24)
GFR, Estimated: >60 mL/min (ref >60)
Glucose, Bld: 98 mg/dL (ref 70–99)
Potassium: 3.8 mmol/L (ref 3.5–5.1)
Sodium: 139 mmol/L (ref 135–145)
Total Bilirubin: 0.4 mg/dL (ref 0.3–1.2)
Total Protein: 7.2 g/dL (ref 6.5–8.1)

## 2023-03-22 LAB — CBC WITH DIFFERENTIAL/PLATELET
Abs Immature Granulocytes: 0.02 10*3/uL (ref 0.00–0.07)
Basophils Absolute: 0 10*3/uL (ref 0.0–0.1)
Basophils Relative: 0 %
Eosinophils Absolute: 0 10*3/uL (ref 0.0–0.5)
Eosinophils Relative: 0 %
HCT: 38.3 % — ABNORMAL LOW (ref 39.0–52.0)
Hemoglobin: 13.3 g/dL (ref 13.0–17.0)
Immature Granulocytes: 0 %
Lymphocytes Relative: 16 %
Lymphs Abs: 1.5 10*3/uL (ref 0.7–4.0)
MCH: 30.4 pg (ref 26.0–34.0)
MCHC: 34.7 g/dL (ref 30.0–36.0)
MCV: 87.6 fL (ref 80.0–100.0)
Monocytes Absolute: 0.8 10*3/uL (ref 0.1–1.0)
Monocytes Relative: 8 %
Neutro Abs: 7 10*3/uL (ref 1.7–7.7)
Neutrophils Relative %: 76 %
Platelets: 190 10*3/uL (ref 150–400)
RBC: 4.37 MIL/uL (ref 4.22–5.81)
RDW: 12 % (ref 11.5–15.5)
WBC: 9.3 10*3/uL (ref 4.0–10.5)
nRBC: 0 % (ref 0.0–0.2)

## 2023-03-22 LAB — LIPASE, BLOOD: Lipase: 18 U/L (ref 11–51)

## 2023-03-22 LAB — SARS CORONAVIRUS 2 BY RT PCR: SARS Coronavirus 2 by RT PCR: NEGATIVE

## 2023-03-22 MED ORDER — SODIUM CHLORIDE 0.9 % IV BOLUS
1000.0000 mL | Freq: Once | INTRAVENOUS | Status: AC
  Administered 2023-03-22: 1000 mL via INTRAVENOUS

## 2023-03-22 MED ORDER — ONDANSETRON 4 MG PO TBDP: 4.0000 mg | ORAL_TABLET | Freq: Three times a day (TID) | ORAL | 0 refills | Status: DC | PRN

## 2023-03-22 MED ORDER — ONDANSETRON HCL 4 MG/2ML IJ SOLN
4.0000 mg | Freq: Once | INTRAMUSCULAR | Status: AC
  Administered 2023-03-22: 4 mg via INTRAVENOUS
  Filled 2023-03-22: qty 2

## 2023-03-22 MED ORDER — ALUM & MAG HYDROXIDE-SIMETH 200-200-20 MG/5ML PO SUSP
30.0000 mL | Freq: Once | ORAL | Status: AC
  Administered 2023-03-22: 30 mL via ORAL
  Filled 2023-03-22: qty 30

## 2023-03-22 MED ORDER — LIDOCAINE VISCOUS HCL 2 % MT SOLN
15.0000 mL | Freq: Once | OROMUCOSAL | Status: AC
  Administered 2023-03-22: 15 mL via ORAL
  Filled 2023-03-22: qty 15

## 2023-03-22 MED ORDER — PANTOPRAZOLE SODIUM 40 MG PO TBEC: 40.0000 mg | DELAYED_RELEASE_TABLET | Freq: Every day | ORAL | 0 refills | Status: AC

## 2023-03-22 NOTE — Discharge Instructions (Signed)
You are seen the emergency room today with abdominal pain.  You may have some inflammation of your stomach lining causing your discomfort.  I would like you to start taking Protonix daily and establish with a primary care doctor.  You may take over-the-counter Maalox as needed for reflux symptoms on top of this.  Have also called in some nausea medication.  If you develop any new or sudden worsening symptoms please return to the emergency department for reevaluation.

## 2023-03-22 NOTE — ED Provider Notes (Signed)
Emergency Department Provider Note   I have reviewed the triage vital signs and the nursing notes.   HISTORY  Chief Complaint Abdominal Pain   HPI Tony Duncan is a 32 y.o. male with past history reviewed below presents emergency department left upper quadrant abdominal pain and nausea/vomiting.  He did have some heavy drinking several days ago but is been taking a little more easy in the past 24 hours.  He developed epigastric discomfort with nausea and vomiting.  He reports some subjective fevers but nothing recorded at home.  No diarrhea.  No lower abdominal or back pain.  He tried Pepcid along with DayQuil with no real improvement.  No prior history of pancreatitis.  Denies any pain in his chest.  He has some baseline shortness of breath noting that he smokes but nothing worse than normal.   Past Medical History:  Diagnosis Date   Migraines     Review of Systems  Constitutional: No fever/chills Cardiovascular: Denies chest pain. Respiratory: Denies shortness of breath. Gastrointestinal: Positive LUQ abdominal pain. Positive nausea and vomiting.  No diarrhea.  No constipation. Musculoskeletal: Negative for back pain. Skin: Negative for rash. Neurological: Negative for headaches.    ____________________________________________   PHYSICAL EXAM:  VITAL SIGNS: ED Triage Vitals  Enc Vitals Group     BP 03/22/23 1959 (!) 171/118     Pulse Rate 03/22/23 1959 92     Resp 03/22/23 1959 20     Temp 03/22/23 1959 98.8 F (37.1 C)     Temp Source 03/22/23 1959 Oral     SpO2 03/22/23 1959 98 %     Weight 03/22/23 1959 180 lb (81.6 kg)     Height 03/22/23 1959 5\' 9"  (1.753 m)   Constitutional: Alert and oriented. Well appearing and in no acute distress. Eyes: Conjunctivae are normal.  Head: Atraumatic. Nose: No congestion/rhinnorhea. Mouth/Throat: Mucous membranes are moist.   Neck: No stridor.   Cardiovascular: Normal rate, regular rhythm. Good peripheral  circulation. Grossly normal heart sounds.   Respiratory: Normal respiratory effort.  No retractions. Lungs CTAB. Gastrointestinal: Soft with mild tenderness in the LUQ. No peritonitis. No distention.  Musculoskeletal: No gross deformities of extremities. Neurologic:  Normal speech and language. Skin:  Skin is warm, dry and intact. No rash noted.   ____________________________________________   LABS (all labs ordered are listed, but only abnormal results are displayed)  Labs Reviewed  SARS CORONAVIRUS 2 BY RT PCR  COMPREHENSIVE METABOLIC PANEL  LIPASE, BLOOD  CBC WITH DIFFERENTIAL/PLATELET   ____________________________________________  RADIOLOGY  No results found.  ____________________________________________   PROCEDURES  Procedure(s) performed:   Procedures   ____________________________________________   INITIAL IMPRESSION / ASSESSMENT AND PLAN / ED COURSE  Pertinent labs & imaging results that were available during my care of the patient were reviewed by me and considered in my medical decision making (see chart for details).   This patient is Presenting for Evaluation of abdominal pain, which does require a range of treatment options, and is a complaint that involves a high risk of morbidity and mortality.  The Differential Diagnoses includes but is not exclusive to acute cholecystitis, intrathoracic causes for epigastric abdominal pain, gastritis, duodenitis, pancreatitis, small bowel or large bowel obstruction, abdominal aortic aneurysm, hernia, gastritis, etc.   Critical Interventions-    Medications  sodium chloride 0.9 % bolus 1,000 mL (1,000 mLs Intravenous New Bag/Given 03/22/23 2038)  ondansetron (ZOFRAN) injection 4 mg (4 mg Intravenous Given 03/22/23 2043)    Reassessment  after intervention:     Clinical Laboratory Tests Ordered, included COVID-negative.  CBC without leukocytosis or anemia.  No acute kidney injury.  LFTs, bilirubin, lipase  normal.  Radiologic Tests Ordered, included CT renal. I independently interpreted the images and agree with radiology interpretation.   Cardiac Monitor Tracing which shows NSR.   Social Determinants of Health Risk patient smokes and is a regular drinker.   Consult complete with  Medical Decision Making: Summary:    Reevaluation with update and discussion with   ***Considered admission***  Patient's presentation is most consistent with acute presentation with potential threat to life or bodily function.   Disposition:   ____________________________________________  FINAL CLINICAL IMPRESSION(S) / ED DIAGNOSES  Final diagnoses:  None     NEW OUTPATIENT MEDICATIONS STARTED DURING THIS VISIT:  New Prescriptions   No medications on file    Note:  This document was prepared using Dragon voice recognition software and may include unintentional dictation errors.  Alona Bene, MD, [facility]

## 2023-03-22 NOTE — ED Triage Notes (Signed)
POV from home, A&O x 4, GCS 15, amb to room  Pt c/o upper left quad pain that began last night, associated NVD, sts body aches, chills, felt like he had a fever. Took pepsid and dayquil earlier today

## 2024-03-05 ENCOUNTER — Other Ambulatory Visit: Payer: Self-pay

## 2024-03-05 ENCOUNTER — Emergency Department (HOSPITAL_BASED_OUTPATIENT_CLINIC_OR_DEPARTMENT_OTHER)
Admission: EM | Admit: 2024-03-05 | Discharge: 2024-03-05 | Disposition: A | Discharge status: Home / Self Care | Attending: Emergency Medicine | Admitting: Emergency Medicine

## 2024-03-05 ENCOUNTER — Encounter (HOSPITAL_BASED_OUTPATIENT_CLINIC_OR_DEPARTMENT_OTHER): Payer: Self-pay | Admitting: Emergency Medicine

## 2024-03-05 ENCOUNTER — Emergency Department (HOSPITAL_BASED_OUTPATIENT_CLINIC_OR_DEPARTMENT_OTHER)

## 2024-03-05 DIAGNOSIS — R1032 Left lower quadrant pain: Secondary | ICD-10-CM | POA: Insufficient documentation

## 2024-03-05 DIAGNOSIS — R109 Unspecified abdominal pain: Secondary | ICD-10-CM

## 2024-03-05 DIAGNOSIS — R1110 Vomiting, unspecified: Secondary | ICD-10-CM | POA: Diagnosis not present

## 2024-03-05 LAB — CBC WITH DIFFERENTIAL/PLATELET
Abs Immature Granulocytes: 0.03 10*3/uL (ref 0.00–0.07)
Basophils Absolute: 0 10*3/uL (ref 0.0–0.1)
Basophils Relative: 0 %
Eosinophils Absolute: 0.2 10*3/uL (ref 0.0–0.5)
Eosinophils Relative: 3 %
HCT: 41.4 % (ref 39.0–52.0)
Hemoglobin: 14.1 g/dL (ref 13.0–17.0)
Immature Granulocytes: 0 %
Lymphocytes Relative: 19 %
Lymphs Abs: 1.7 10*3/uL (ref 0.7–4.0)
MCH: 30.9 pg (ref 26.0–34.0)
MCHC: 34.1 g/dL (ref 30.0–36.0)
MCV: 90.6 fL (ref 80.0–100.0)
Monocytes Absolute: 0.7 10*3/uL (ref 0.1–1.0)
Monocytes Relative: 8 %
Neutro Abs: 6.2 10*3/uL (ref 1.7–7.7)
Neutrophils Relative %: 70 %
Platelets: 232 10*3/uL (ref 150–400)
RBC: 4.57 MIL/uL (ref 4.22–5.81)
RDW: 12.8 % (ref 11.5–15.5)
WBC: 8.9 10*3/uL (ref 4.0–10.5)
nRBC: 0 % (ref 0.0–0.2)

## 2024-03-05 LAB — COMPREHENSIVE METABOLIC PANEL WITH GFR
ALT: 35 U/L (ref 0–44)
AST: 30 U/L (ref 15–41)
Albumin: 4.4 g/dL (ref 3.5–5.0)
Alkaline Phosphatase: 69 U/L (ref 38–126)
Anion gap: 14 (ref 5–15)
BUN: 19 mg/dL (ref 6–20)
CO2: 25 mmol/L (ref 22–32)
Calcium: 10 mg/dL (ref 8.9–10.3)
Chloride: 101 mmol/L (ref 98–111)
Creatinine, Ser: 0.86 mg/dL (ref 0.61–1.24)
GFR, Estimated: >60 mL/min (ref >60)
Glucose, Bld: 92 mg/dL (ref 70–99)
Potassium: 4.4 mmol/L (ref 3.5–5.1)
Sodium: 141 mmol/L (ref 135–145)
Total Bilirubin: 0.3 mg/dL (ref 0.0–1.2)
Total Protein: 7.5 g/dL (ref 6.5–8.1)

## 2024-03-05 LAB — URINALYSIS, ROUTINE W REFLEX MICROSCOPIC
Bilirubin Urine: NEGATIVE
Glucose, UA: NEGATIVE mg/dL
Hgb urine dipstick: NEGATIVE
Leukocytes,Ua: NEGATIVE
Nitrite: NEGATIVE
Protein, ur: NEGATIVE mg/dL
Specific Gravity, Urine: 1.025 (ref 1.005–1.030)
pH: 5.5 (ref 5.0–8.0)

## 2024-03-05 LAB — LIPASE, BLOOD: Lipase: 27 U/L (ref 11–51)

## 2024-03-05 MED ORDER — ONDANSETRON HCL 4 MG/2ML IJ SOLN
4.0000 mg | Freq: Once | INTRAMUSCULAR | Status: AC
  Administered 2024-03-05: 4 mg via INTRAVENOUS
  Filled 2024-03-05: qty 2

## 2024-03-05 MED ORDER — ONDANSETRON HCL 4 MG PO TABS
4.0000 mg | ORAL_TABLET | Freq: Four times a day (QID) | ORAL | 0 refills | Status: AC
Start: 2024-03-05 — End: ?

## 2024-03-05 MED ORDER — SODIUM CHLORIDE 0.9 % IV BOLUS
1000.0000 mL | Freq: Once | INTRAVENOUS | Status: AC
  Administered 2024-03-05: 1000 mL via INTRAVENOUS

## 2024-03-05 MED ORDER — MORPHINE SULFATE (PF) 4 MG/ML IV SOLN
4.0000 mg | Freq: Once | INTRAVENOUS | Status: AC
  Administered 2024-03-05: 4 mg via INTRAVENOUS
  Filled 2024-03-05: qty 1

## 2024-03-05 MED ORDER — CYCLOBENZAPRINE HCL 10 MG PO TABS: 10.0000 mg | ORAL_TABLET | Freq: Two times a day (BID) | ORAL | 0 refills | Status: AC | PRN

## 2024-03-05 MED ORDER — KETOROLAC TROMETHAMINE 15 MG/ML IJ SOLN
15.0000 mg | Freq: Once | INTRAMUSCULAR | Status: AC
  Administered 2024-03-05: 15 mg via INTRAVENOUS
  Filled 2024-03-05: qty 1

## 2024-03-05 NOTE — ED Triage Notes (Signed)
 LLQ pain x  for a while Som nausea Seen by pcp yesterday, suggest US   Pain worse after exam yesterday

## 2024-03-05 NOTE — Discharge Instructions (Addendum)
 Evaluation today was overall reassuring.  Please follow-up with your PCP.  If your symptoms worsen please return to the ED for further evaluation.  I sent Zofran  to your pharmacy to help with nausea and vomiting.  If you have pain recommend Tylenol and ibuprofen.  Also sent a few tablets of Flexeril  to your pharmacy for your back pain.

## 2024-03-05 NOTE — ED Notes (Signed)
 Patient denies pain and is resting comfortably.

## 2024-03-05 NOTE — ED Provider Notes (Addendum)
 [place] EMERGENCY DEPARTMENT AT [facility] Provider Note   CSN: [id number] Arrival date & time: 03/05/24  1640     Patient presents with: Abdominal Pain  HPI Tony Duncan is a 33 y.o. male presenting for abdominal pain.  It is located in the left lower quadrant and left flank.  He states that it has been there for couple months but definitely much worse in the last two days.  Endorses nausea and soft stool but no diarrhea or vomiting.  Can be worse with movement.  Denies urinary symptoms.  Denies fever.  Past Medical History:  Diagnosis Date   Migraines        Abdominal Pain      Prior to Admission medications   Medication Sig Start Date End Date Taking? Authorizing Provider  cyclobenzaprine  (FLEXERIL ) 10 MG tablet Take 1 tablet (10 mg total) by mouth 2 (two) times daily as needed for muscle spasms. 03/05/24  Yes Hovanes Hymas K, PA-C  benzonatate  (TESSALON ) 100 MG capsule Take 1 capsule (100 mg total) by mouth every 8 (eight) hours. 05/27/22   Delray Fielding, PA-C  ondansetron  (ZOFRAN ) 4 MG tablet Take 1 tablet (4 mg total) by mouth every 6 (six) hours. 03/05/24  Yes Janalee Mcmurray, PA-C  ondansetron  (ZOFRAN -ODT) 4 MG disintegrating tablet Take 1 tablet (4 mg total) by mouth every 8 (eight) hours as needed. 03/22/23   Long, Shereen Dike, MD  pantoprazole  (PROTONIX ) 40 MG tablet Take 1 tablet (40 mg total) by mouth daily. 03/22/23 04/21/23  Roberts Ching, MD    Allergies: Iodinated contrast media, Iodine, and Sulfa antibiotics    Review of Systems  Gastrointestinal:  Positive for abdominal pain.    Updated Vital Signs BP (!) 139/101 (BP Location: Right Arm)   Pulse 78   Temp 99 F (37.2 C) (Oral)   Resp 18   SpO2 96%   Physical Exam Vitals and nursing note reviewed.  HENT:     Head: Normocephalic and atraumatic.     Mouth/Throat:     Mouth: Mucous membranes are moist.   Eyes:     General:        Right eye: No discharge.        Left eye: No discharge.      Conjunctiva/sclera: Conjunctivae normal.    Cardiovascular:     Rate and Rhythm: Normal rate and regular rhythm.     Pulses: Normal pulses.     Heart sounds: Normal heart sounds.  Pulmonary:     Effort: Pulmonary effort is normal.     Breath sounds: Normal breath sounds.  Abdominal:     General: Abdomen is flat. There is no distension.     Palpations: Abdomen is soft.     Tenderness: There is abdominal tenderness in the left lower quadrant.   Skin:    General: Skin is warm and dry.   Neurological:     General: No focal deficit present.   Psychiatric:        Mood and Affect: Mood normal.     (all labs ordered are listed, but only abnormal results are displayed) Labs Reviewed  URINALYSIS, ROUTINE W REFLEX MICROSCOPIC - Abnormal; Notable for the following components:      Result Value   Ketones, ur TRACE (*)    All other components within normal limits  CBC WITH DIFFERENTIAL/PLATELET  COMPREHENSIVE METABOLIC PANEL WITH GFR  LIPASE, BLOOD    EKG: None  Radiology: CT ABDOMEN PELVIS WO CONTRAST Result Date:  03/05/2024 CLINICAL DATA:  Left lower quadrant abdominal pain. EXAM: CT ABDOMEN AND PELVIS WITHOUT CONTRAST TECHNIQUE: Multidetector CT imaging of the abdomen and pelvis was performed following the standard protocol without IV contrast. RADIATION DOSE REDUCTION: This exam was performed according to the departmental dose-optimization program which includes automated exposure control, adjustment of the mA and/or kV according to patient size and/or use of iterative reconstruction technique. COMPARISON:  CT abdomen pelvis dated 10/21/2022. FINDINGS: Evaluation of this exam is limited in the absence of intravenous contrast. Lower chest: The visualized lung bases are clear. No intra-abdominal free air or free fluid. Hepatobiliary: The liver is unremarkable. No biliary ductal dilatation. The gallbladder is unremarkable. Pancreas: Unremarkable. No pancreatic ductal dilatation or  surrounding inflammatory changes. Spleen: Normal in size without focal abnormality. Adrenals/Urinary Tract: The adrenal glands unremarkable. The kidneys, visualized ureters, and urinary bladder appear unremarkable. Stomach/Bowel: There is no bowel obstruction or active inflammation. The appendix is normal. Vascular/Lymphatic: The abdominal aorta and IVC are unremarkable on this noncontrast CT. No portal venous gas. There is no adenopathy. Reproductive: The prostate and seminal vesicles are grossly remarkable. Other: Small fat containing right inguinal hernia. Musculoskeletal: No acute or significant osseous findings. IMPRESSION: 1. No acute intra-abdominal or pelvic pathology. No hydronephrosis or nephrolithiasis. 2. No bowel obstruction. Normal appendix. Electronically Signed   By: Angus Bark M.D.   On: 03/05/2024 17:27     Procedures   Medications Ordered in the ED  ketorolac  (TORADOL ) 15 MG/ML injection 15 mg (has no administration in time range)  sodium chloride  0.9 % bolus 1,000 mL (1,000 mLs Intravenous New Bag/Given 03/05/24 1737)  ondansetron  (ZOFRAN ) injection 4 mg (4 mg Intravenous Given 03/05/24 1735)  morphine (PF) 4 MG/ML injection 4 mg (4 mg Intravenous Given 03/05/24 1739)                                    Medical Decision Making Amount and/or Complexity of Data Reviewed Labs: ordered. Radiology: ordered.  Risk Prescription drug management.   Initial Impression and Ddx 33 year old well-appearing male presenting for abdominal pain.  Exam notable for left lower quadrant tenderness.  DDx includes diverticulitis, appendicitis, kidney stone, pyelonephritis, UTI, other. Patient PMH that increases complexity of ED encounter:  none  Interpretation of Diagnostics - I independent reviewed and interpreted the labs as followed: none  - I independently visualized the following imaging with scope of interpretation limited to determining acute life threatening conditions related  to emergency care: CT, which revealed no acute findings  Patient Reassessment and Ultimate Disposition/Management Workup unremarkable.  On reassessment symptoms had improved considerably.  Suspect this could be MSK etiology and/or viral illness.  Fluid challenge with no complication.  Advised supportive treatment at home.  Advised to follow-up with PCP.  Discussed return precautions.  Sent Zofran  to his pharmacy.  Also at his request sent a few tablets of Flexeril  for chronic back pain to his pharmacy.  Discharged in good condition.  Patient management required discussion with the following services or consulting groups:  None  Complexity of Problems Addressed Acute complicated illness or Injury  Additional Data Reviewed and Analyzed Further history obtained from: Past medical history and medications listed in the EMR and Prior ED visit notes  Patient Encounter Risk Assessment Prescriptions      Final diagnoses:  Abdominal pain, unspecified abdominal location    ED Discharge Orders  Ordered    ondansetron  (ZOFRAN ) 4 MG tablet  Every 6 hours        03/05/24 1909    cyclobenzaprine  (FLEXERIL ) 10 MG tablet  2 times daily PRN        03/05/24 1918               Janalee Mcmurray, PA-C 03/05/24 1918    Hershel Los, MD 03/05/24 2309

## 2024-03-05 NOTE — ED Notes (Signed)
 Dc instructions given, pt verbalized understanding. PIV removed. Rx sent to pt preferred pharmacy. Out of ED with steady gait with significant other not in visible distress.

## 2024-03-05 NOTE — ED Notes (Signed)
 Pt aware of the need for a urine... Unable to currently provide a sample.Marland KitchenMarland Kitchen

## 2024-03-26 NOTE — Nursing Note (Signed)
 There being no adverse reaction noted or reported Pt released.

## 2024-09-27 ENCOUNTER — Emergency Department (HOSPITAL_COMMUNITY)
Admission: EM | Admit: 2024-09-27 | Discharge: 2024-09-28 | Disposition: A | Discharge status: Home / Self Care | Attending: Emergency Medicine | Admitting: Emergency Medicine

## 2024-09-27 ENCOUNTER — Encounter (HOSPITAL_COMMUNITY): Payer: Self-pay

## 2024-09-27 ENCOUNTER — Other Ambulatory Visit: Payer: Self-pay

## 2024-09-27 DIAGNOSIS — R110 Nausea: Secondary | ICD-10-CM | POA: Diagnosis not present

## 2024-09-27 DIAGNOSIS — R10A2 Flank pain, left side: Secondary | ICD-10-CM | POA: Insufficient documentation

## 2024-09-27 DIAGNOSIS — I10 Essential (primary) hypertension: Secondary | ICD-10-CM | POA: Insufficient documentation

## 2024-09-27 DIAGNOSIS — R1032 Left lower quadrant pain: Secondary | ICD-10-CM | POA: Insufficient documentation

## 2024-09-27 HISTORY — DX: Essential (primary) hypertension: I10

## 2024-09-27 NOTE — ED Triage Notes (Signed)
 Pt has had left flank pain that radiates into left groin region for a while with worsening today. Pt also reports difficulty urinating and history of kidney stones.

## 2024-09-27 NOTE — ED Provider Notes (Signed)
 " [place] EMERGENCY DEPARTMENT AT [facility] Provider Note   CSN: [id number] Arrival date & time: 09/27/24  2325     Patient presents with: Flank Pain   Tony Duncan is a 34 y.o. male.  {Add pertinent medical, surgical, social history, OB history to YEP:67052} The history is provided by the patient.  Flank Pain  Tony Duncan is a 34 y.o. male who presents to the Emergency Department complaining of *** Several months of left flank pain, sig worse today.   Had nausea. Worse with movement. No injuries. Radiates to flank.   Clammy this morning.  No fever.  Decreased urine today. No dysuria.   Remote hx/o of HTN - not on meds now.       Prior to Admission medications  Medication Sig Start Date End Date Taking? Authorizing Provider  benzonatate  (TESSALON ) 100 MG capsule Take 1 capsule (100 mg total) by mouth every 8 (eight) hours. 05/27/22   Emelia Sluder, PA-C  cyclobenzaprine  (FLEXERIL ) 10 MG tablet Take 1 tablet (10 mg total) by mouth 2 (two) times daily as needed for muscle spasms. 03/05/24   Robinson, John K, PA-C  ondansetron  (ZOFRAN ) 4 MG tablet Take 1 tablet (4 mg total) by mouth every 6 (six) hours. 03/05/24   Robinson, John K, PA-C  ondansetron  (ZOFRAN -ODT) 4 MG disintegrating tablet Take 1 tablet (4 mg total) by mouth every 8 (eight) hours as needed. 03/22/23   Long, Fonda MATSU, MD  pantoprazole  (PROTONIX ) 40 MG tablet Take 1 tablet (40 mg total) by mouth daily. 03/22/23 04/21/23  Darra Fonda MATSU, MD    Allergies: Iodinated contrast media, Iodine, and Sulfa antibiotics    Review of Systems  Genitourinary:  Positive for flank pain.  All other systems reviewed and are negative.   Updated Vital Signs BP (!) 158/114   Pulse (!) 106   Temp 98.5 F (36.9 C) (Oral)   Resp 16   SpO2 100%   Physical Exam Vitals and nursing note reviewed.  Constitutional:      Appearance: He is well-developed.  HENT:     Head: Normocephalic and atraumatic.  Cardiovascular:      Rate and Rhythm: Normal rate and regular rhythm.  Pulmonary:     Effort: Pulmonary effort is normal. No respiratory distress.  Abdominal:     Palpations: Abdomen is soft.     Tenderness: There is no guarding or rebound.     Comments: Moderate LLQ tenderness  Musculoskeletal:        General: No tenderness.  Skin:    General: Skin is warm and dry.  Neurological:     Mental Status: He is alert and oriented to person, place, and time.  Psychiatric:        Behavior: Behavior normal.     (all labs ordered are listed, but only abnormal results are displayed) Labs Reviewed  URINALYSIS, ROUTINE W REFLEX MICROSCOPIC  BASIC METABOLIC PANEL WITH GFR  CBC    EKG: None  Radiology: No results found.  {Document cardiac monitor, telemetry assessment procedure when appropriate:32947} Procedures   Medications Ordered in the ED - No data to display    {Click here for ABCD2, HEART and other calculators REFRESH Note before signing:1}                              Medical Decision Making Amount and/or Complexity of Data Reviewed Labs: ordered. Radiology: ordered.  Risk Prescription drug management.   ***  {  Document critical care time when appropriate  Document review of labs and clinical decision tools ie CHADS2VASC2, etc  Document your independent review of radiology images and any outside records  Document your discussion with family members, caretakers and with consultants  Document social determinants of health affecting pt's care  Document your decision making why or why not admission, treatments were needed:32947:::1}   Final diagnoses:  None    ED Discharge Orders     None        "

## 2024-09-28 ENCOUNTER — Emergency Department (HOSPITAL_COMMUNITY)

## 2024-09-28 LAB — CBC
HCT: 40.7 % (ref 39.0–52.0)
Hemoglobin: 13.9 g/dL (ref 13.0–17.0)
MCH: 30.4 pg (ref 26.0–34.0)
MCHC: 34.2 g/dL (ref 30.0–36.0)
MCV: 89.1 fL (ref 80.0–100.0)
Platelets: 262 K/uL (ref 150–400)
RBC: 4.57 MIL/uL (ref 4.22–5.81)
RDW: 12.5 % (ref 11.5–15.5)
WBC: 9.7 K/uL (ref 4.0–10.5)
nRBC: 0 % (ref 0.0–0.2)

## 2024-09-28 LAB — URINALYSIS, ROUTINE W REFLEX MICROSCOPIC
Bilirubin Urine: NEGATIVE
Glucose, UA: NEGATIVE mg/dL
Hgb urine dipstick: NEGATIVE
Ketones, ur: NEGATIVE mg/dL
Leukocytes,Ua: NEGATIVE
Nitrite: NEGATIVE
Protein, ur: NEGATIVE mg/dL
Specific Gravity, Urine: 1.025 (ref 1.005–1.030)
pH: 5 (ref 5.0–8.0)

## 2024-09-28 LAB — BASIC METABOLIC PANEL WITH GFR
Anion gap: 11 (ref 5–15)
BUN: 17 mg/dL (ref 6–20)
CO2: 28 mmol/L (ref 22–32)
Calcium: 10.4 mg/dL — ABNORMAL HIGH (ref 8.9–10.3)
Chloride: 101 mmol/L (ref 98–111)
Creatinine, Ser: 0.96 mg/dL (ref 0.61–1.24)
GFR, Estimated: >60 mL/min
Glucose, Bld: 102 mg/dL — ABNORMAL HIGH (ref 70–99)
Potassium: 3.8 mmol/L (ref 3.5–5.1)
Sodium: 140 mmol/L (ref 135–145)

## 2024-09-28 MED ORDER — OXYCODONE-ACETAMINOPHEN 5-325 MG PO TABS
1.0000 | ORAL_TABLET | Freq: Once | ORAL | Status: AC
  Administered 2024-09-28: 1 via ORAL
  Filled 2024-09-28: qty 1

## 2024-09-28 MED ORDER — ONDANSETRON 4 MG PO TBDP: 4.0000 mg | ORAL_TABLET | Freq: Three times a day (TID) | ORAL | 0 refills | Status: AC | PRN

## 2024-09-28 MED ORDER — KETOROLAC TROMETHAMINE 15 MG/ML IJ SOLN
15.0000 mg | Freq: Once | INTRAMUSCULAR | Status: AC
  Administered 2024-09-28: 15 mg via INTRAVENOUS
  Filled 2024-09-28: qty 1

## 2024-09-28 MED ORDER — SODIUM CHLORIDE 0.9 % IV BOLUS
500.0000 mL | Freq: Once | INTRAVENOUS | Status: AC
  Administered 2024-09-28: 500 mL via INTRAVENOUS

## 2024-09-28 NOTE — ED Notes (Signed)
 Pt axox4. GCS 15. PT and significant other of patient verbalize understanding of discharge instructions, follow up and new rx. Pt ambulated out of er with steady gait to transportation home with significant other

## 2024-09-28 NOTE — ED Notes (Signed)
 First poc with patient. Pt resting in bed with significant other at bedside. No respiratory distress noted. Pt c/o left flank pain.  Vs obtained and recorded.  PT axox4. GCS 15.
